# Patient Record
Sex: Male | Born: 1945 | Race: Black or African American | Hispanic: No
Health system: Southern US, Community
[De-identification: ages and names within clinical notes are randomized; demographics above are authoritative.]

---

## 1997-06-14 ENCOUNTER — Emergency Department (HOSPITAL_COMMUNITY): Admission: EM | Admit: 1997-06-14 | Discharge: 1997-06-14 | Payer: Self-pay | Admitting: Internal Medicine

## 1998-03-25 ENCOUNTER — Emergency Department (HOSPITAL_COMMUNITY): Admission: EM | Admit: 1998-03-25 | Discharge: 1998-03-25 | Payer: Self-pay | Admitting: Emergency Medicine

## 1998-03-25 ENCOUNTER — Encounter: Payer: Self-pay | Admitting: Emergency Medicine

## 2000-03-11 ENCOUNTER — Emergency Department (HOSPITAL_COMMUNITY): Admission: EM | Admit: 2000-03-11 | Discharge: 2000-03-11 | Payer: Self-pay | Admitting: Emergency Medicine

## 2000-12-05 ENCOUNTER — Emergency Department (HOSPITAL_COMMUNITY): Admission: EM | Admit: 2000-12-05 | Discharge: 2000-12-05 | Payer: Self-pay

## 2001-03-06 ENCOUNTER — Emergency Department (HOSPITAL_COMMUNITY): Admission: EM | Admit: 2001-03-06 | Discharge: 2001-03-06 | Payer: Self-pay | Admitting: Emergency Medicine

## 2003-01-07 ENCOUNTER — Inpatient Hospital Stay (HOSPITAL_COMMUNITY): Admission: EM | Admit: 2003-01-07 | Discharge: 2003-01-11 | Payer: Self-pay | Admitting: Emergency Medicine

## 2003-01-22 ENCOUNTER — Encounter: Admission: RE | Admit: 2003-01-22 | Discharge: 2003-01-22 | Payer: Self-pay | Admitting: Family Medicine

## 2003-01-28 ENCOUNTER — Encounter: Admission: RE | Admit: 2003-01-28 | Discharge: 2003-01-28 | Payer: Self-pay | Admitting: Sports Medicine

## 2003-02-05 ENCOUNTER — Encounter: Admission: RE | Admit: 2003-02-05 | Discharge: 2003-02-05 | Payer: Self-pay | Admitting: Family Medicine

## 2003-03-15 ENCOUNTER — Encounter: Admission: RE | Admit: 2003-03-15 | Discharge: 2003-03-15 | Payer: Self-pay | Admitting: Family Medicine

## 2003-05-03 ENCOUNTER — Encounter: Admission: RE | Admit: 2003-05-03 | Discharge: 2003-05-03 | Payer: Self-pay | Admitting: Sports Medicine

## 2003-06-21 ENCOUNTER — Ambulatory Visit (HOSPITAL_COMMUNITY): Admission: RE | Admit: 2003-06-21 | Discharge: 2003-06-21 | Payer: Self-pay | Admitting: Family Medicine

## 2003-06-21 ENCOUNTER — Encounter: Admission: RE | Admit: 2003-06-21 | Discharge: 2003-06-21 | Payer: Self-pay | Admitting: Family Medicine

## 2003-07-15 ENCOUNTER — Inpatient Hospital Stay (HOSPITAL_COMMUNITY): Admission: EM | Admit: 2003-07-15 | Discharge: 2003-07-24 | Payer: Self-pay | Admitting: Emergency Medicine

## 2003-08-09 ENCOUNTER — Encounter: Admission: RE | Admit: 2003-08-09 | Discharge: 2003-08-09 | Payer: Self-pay | Admitting: Family Medicine

## 2003-08-23 ENCOUNTER — Ambulatory Visit: Payer: Self-pay | Admitting: Sports Medicine

## 2003-08-23 ENCOUNTER — Inpatient Hospital Stay (HOSPITAL_COMMUNITY): Admission: EM | Admit: 2003-08-23 | Discharge: 2003-09-03 | Payer: Self-pay | Admitting: Emergency Medicine

## 2003-09-16 ENCOUNTER — Ambulatory Visit: Payer: Self-pay | Admitting: Family Medicine

## 2003-10-18 ENCOUNTER — Ambulatory Visit: Payer: Self-pay | Admitting: Family Medicine

## 2003-11-11 ENCOUNTER — Observation Stay (HOSPITAL_COMMUNITY): Admission: EM | Admit: 2003-11-11 | Discharge: 2003-11-12 | Payer: Self-pay

## 2003-11-11 ENCOUNTER — Ambulatory Visit (HOSPITAL_COMMUNITY): Admission: RE | Admit: 2003-11-11 | Discharge: 2003-11-11 | Payer: Self-pay | Admitting: Family Medicine

## 2003-11-11 ENCOUNTER — Ambulatory Visit: Payer: Self-pay | Admitting: Family Medicine

## 2003-12-20 ENCOUNTER — Ambulatory Visit (HOSPITAL_COMMUNITY): Admission: RE | Admit: 2003-12-20 | Discharge: 2003-12-20 | Payer: Self-pay | Admitting: Family Medicine

## 2003-12-20 ENCOUNTER — Ambulatory Visit: Payer: Self-pay | Admitting: Family Medicine

## 2003-12-20 ENCOUNTER — Inpatient Hospital Stay (HOSPITAL_COMMUNITY): Admission: AD | Admit: 2003-12-20 | Discharge: 2003-12-27 | Payer: Self-pay | Admitting: Family Medicine

## 2003-12-20 ENCOUNTER — Ambulatory Visit: Payer: Self-pay | Admitting: Sports Medicine

## 2004-01-17 ENCOUNTER — Inpatient Hospital Stay (HOSPITAL_COMMUNITY): Admission: AD | Admit: 2004-01-17 | Discharge: 2004-02-21 | Payer: Self-pay | Admitting: Family Medicine

## 2004-01-17 ENCOUNTER — Ambulatory Visit: Payer: Self-pay | Admitting: Family Medicine

## 2004-01-17 ENCOUNTER — Ambulatory Visit: Payer: Self-pay | Admitting: Internal Medicine

## 2004-01-17 ENCOUNTER — Ambulatory Visit: Payer: Self-pay | Admitting: Sports Medicine

## 2004-03-05 ENCOUNTER — Ambulatory Visit: Payer: Self-pay | Admitting: Family Medicine

## 2004-03-06 ENCOUNTER — Encounter: Admission: RE | Admit: 2004-03-06 | Discharge: 2004-03-06 | Payer: Self-pay | Admitting: Family Medicine

## 2004-03-09 ENCOUNTER — Ambulatory Visit: Payer: Self-pay | Admitting: Family Medicine

## 2004-03-09 ENCOUNTER — Inpatient Hospital Stay (HOSPITAL_COMMUNITY): Admission: EM | Admit: 2004-03-09 | Discharge: 2004-03-17 | Payer: Self-pay | Admitting: Emergency Medicine

## 2004-03-23 ENCOUNTER — Ambulatory Visit: Payer: Self-pay | Admitting: Internal Medicine

## 2004-04-10 ENCOUNTER — Inpatient Hospital Stay (HOSPITAL_COMMUNITY): Admission: EM | Admit: 2004-04-10 | Discharge: 2004-04-20 | Payer: Self-pay | Admitting: Emergency Medicine

## 2004-04-10 ENCOUNTER — Ambulatory Visit: Payer: Self-pay | Admitting: Infectious Diseases

## 2004-04-13 ENCOUNTER — Ambulatory Visit: Payer: Self-pay | Admitting: Internal Medicine

## 2004-04-16 ENCOUNTER — Ambulatory Visit: Payer: Self-pay | Admitting: Internal Medicine

## 2004-05-04 DEATH — deceased

## 2005-06-18 IMAGING — CR DG CHEST 2V
2 series · 2 of 2 positions shown · non-contrast
Comparison: 12/13/03 and 08/29/03.

CLINICAL DATA: Shortness of breath. 
 PA AND LATERAL CHEST 01/17/04:

[view not recorded (1 of 2)]
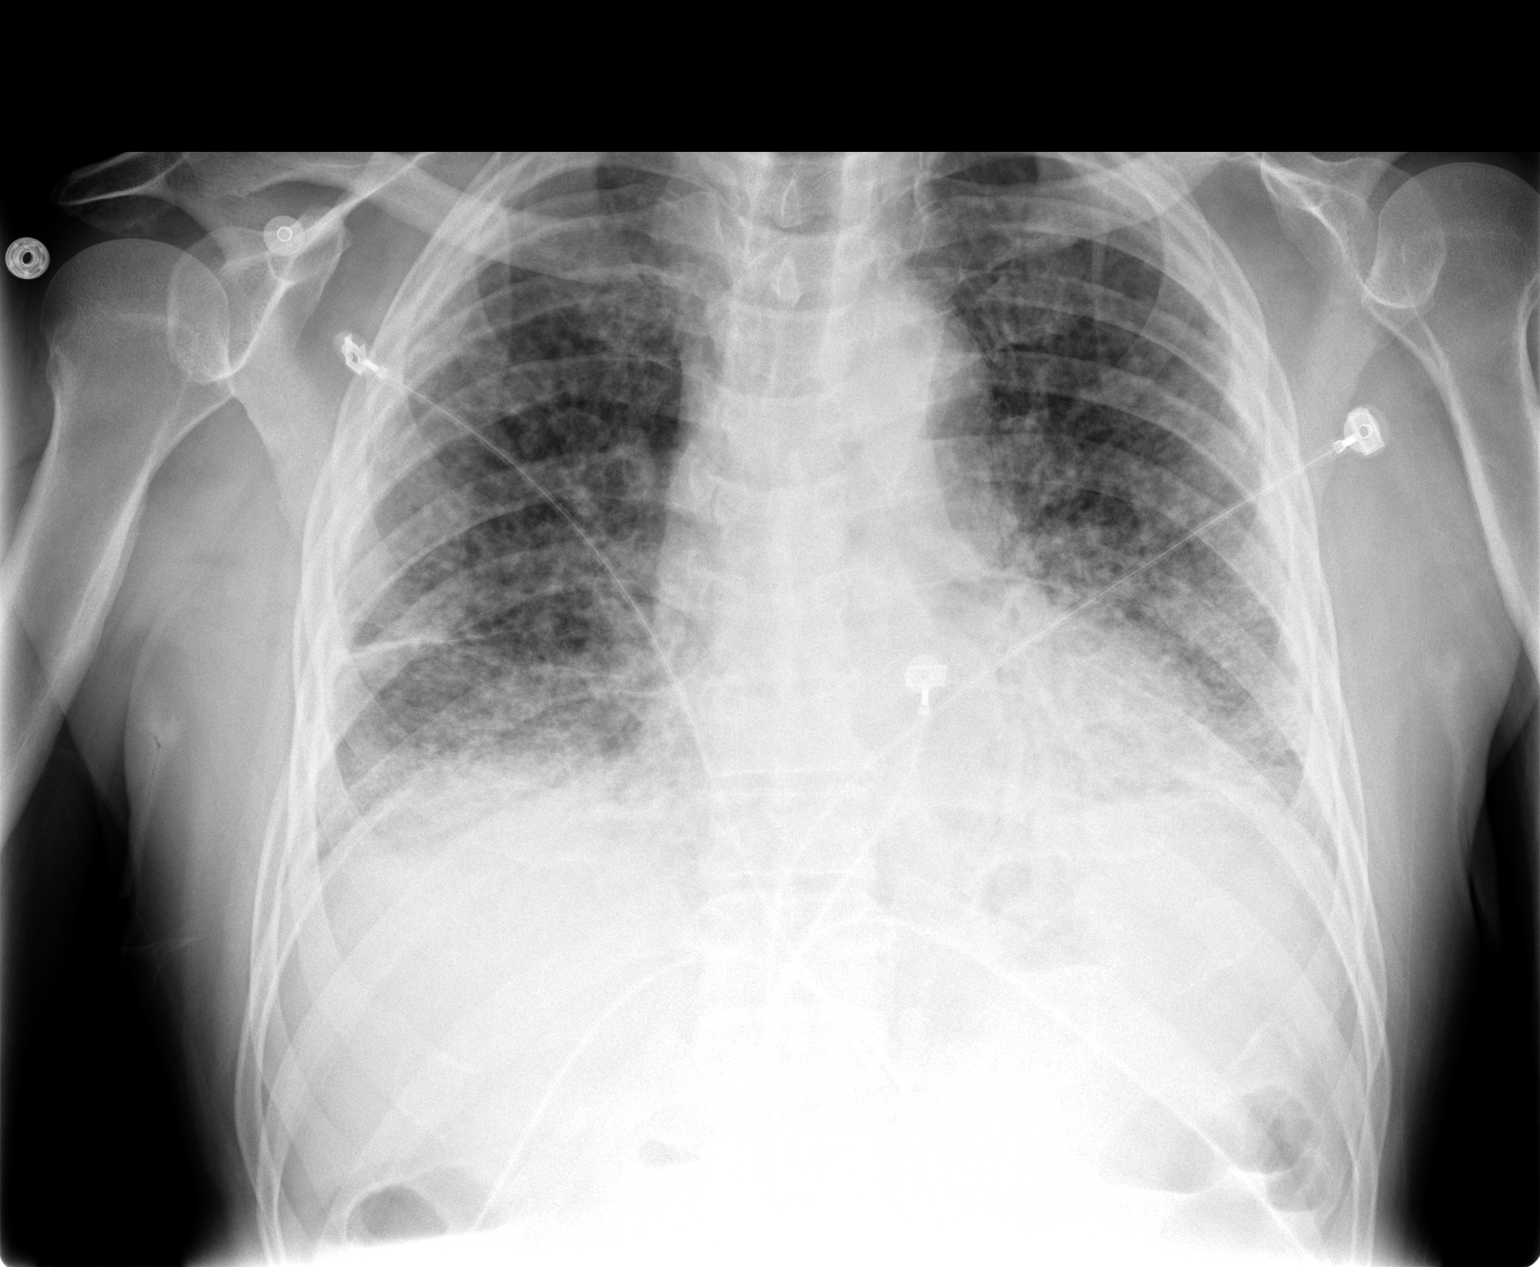

[view not recorded (2 of 2)]
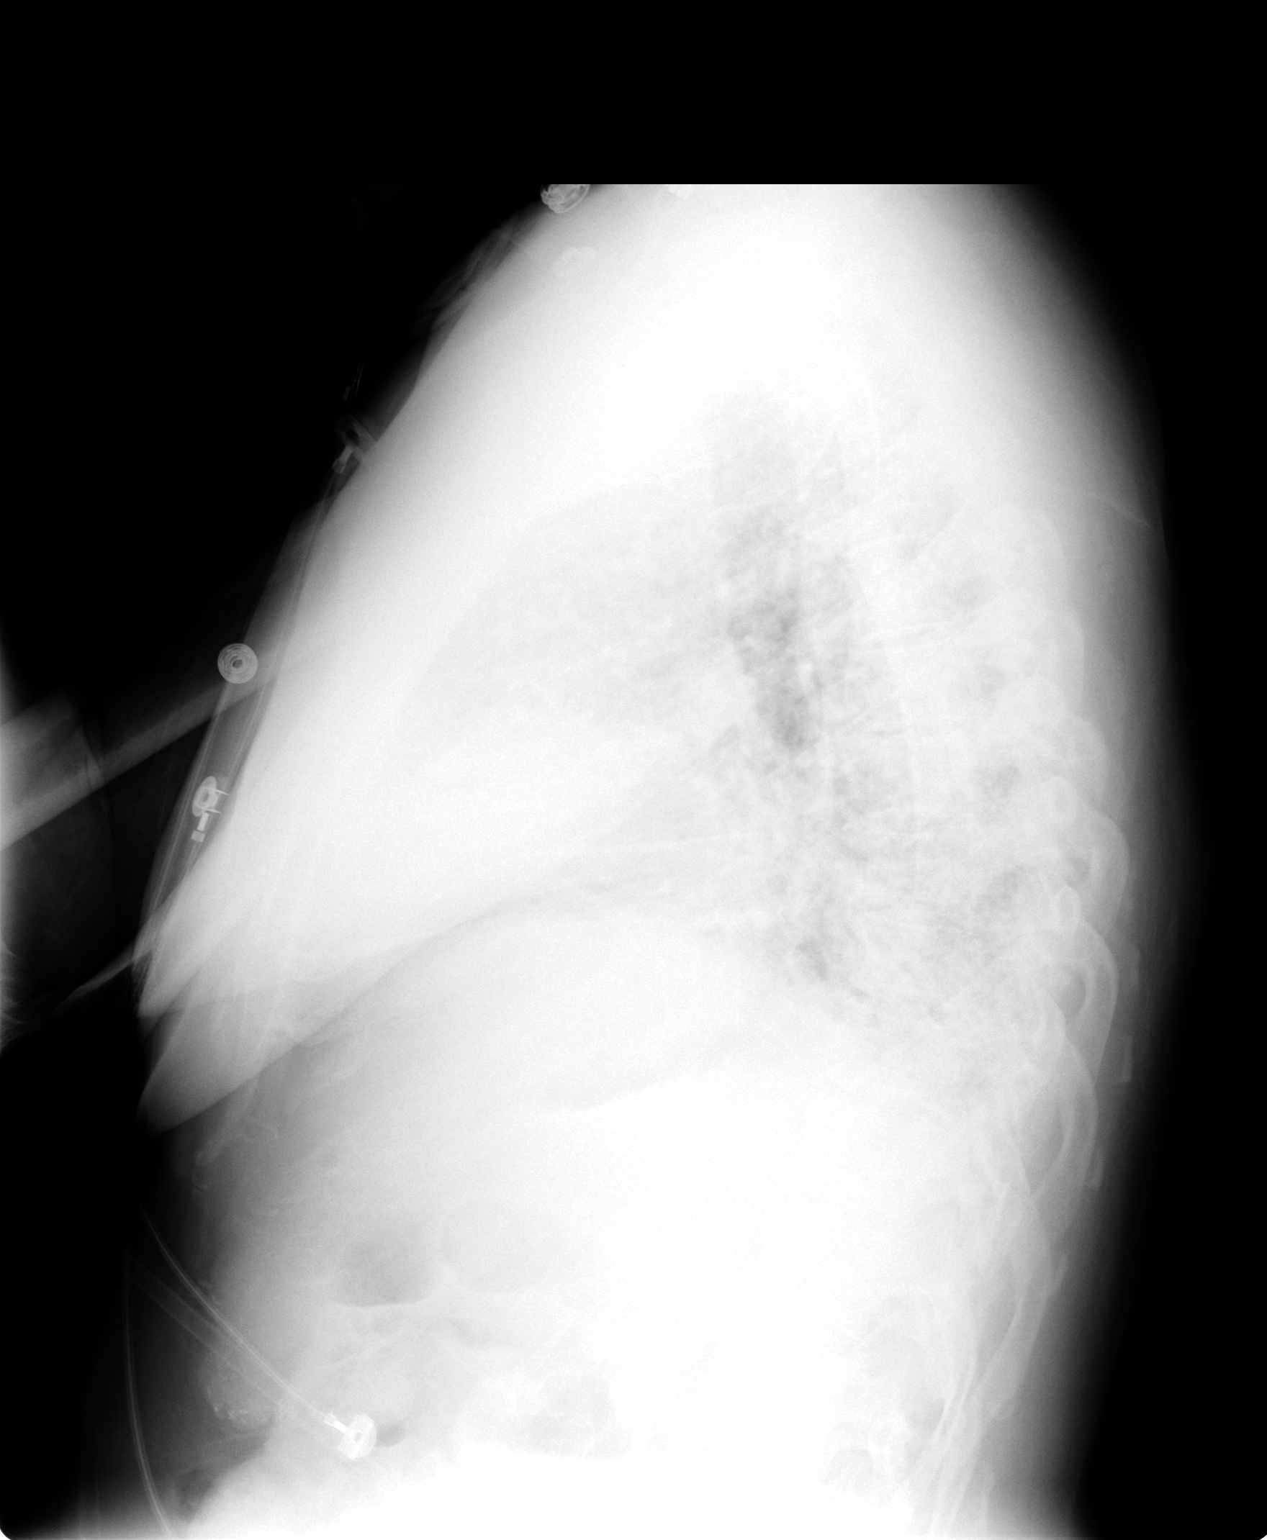

[2 of 2 positions shown; findings below may reference images not displayed]

FINDINGS: The appearance of the patient?s chest is not markedly changed.  Again seen are bilateral diffuse reticular nodular opacities most compatible with chronic interstitial lung disease.  No definite new abnormality is identified.
IMPRESSION: Findings in keeping with chronic interstitial lung disease.  Superimposed infection cannot be excluded given the degree of airspace disease.

## 2005-08-06 IMAGING — CR DG CHEST 2V
2 series · 2 of 2 positions shown · non-contrast
Comparison: none

CLINICAL DATA: Cough and shortness of breath. 
 TWO VIEW CHEST: 
 Two views of the chest are compared to prior films from 02/14/04.  There is a dense reticulonodular intersitial pattern in the lungs which is a stable finding when looking back to prior films from 7882.  Low lung volumes with vascular crowding and basilar atelectasis.  No definite pleural effusions are seen.  High resolution chest CT may be helpful for further evaluation of this process.

[view not recorded (1 of 2)]
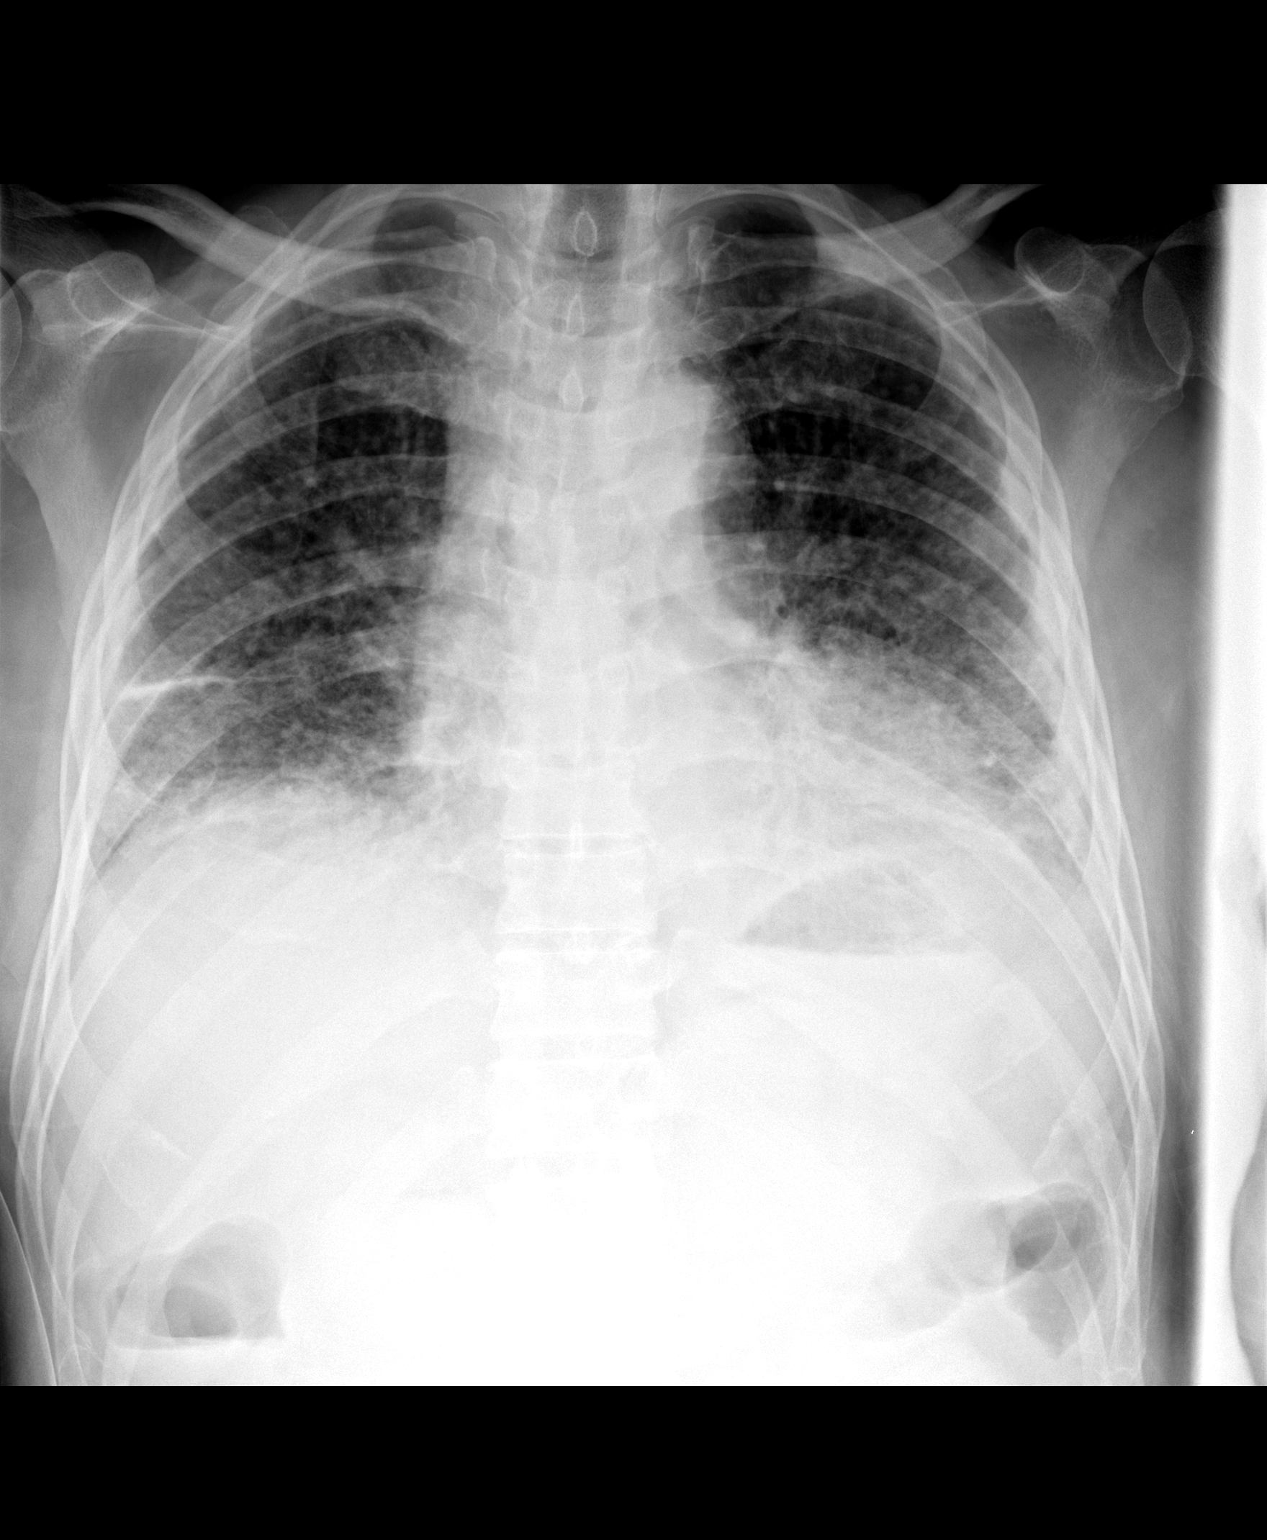

[view not recorded (2 of 2)]
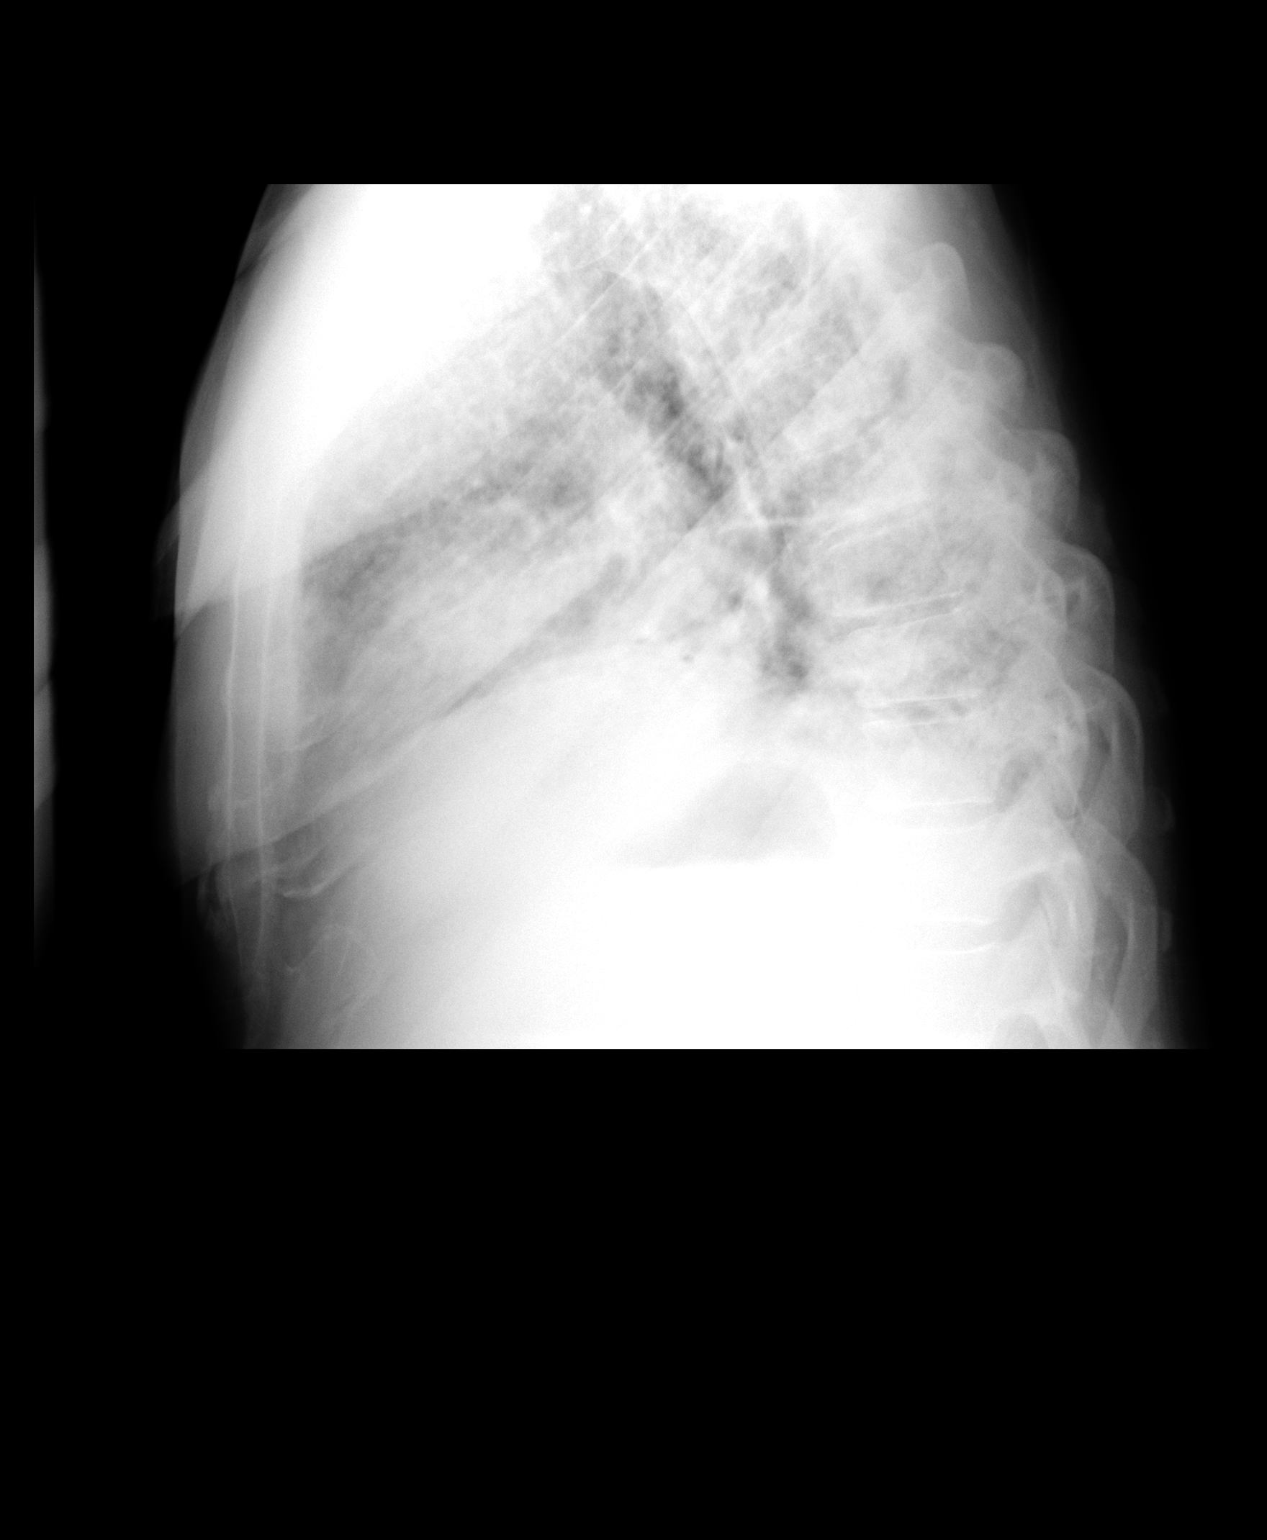

[2 of 2 positions shown; findings below may reference images not displayed]

IMPRESSION: 1.  Dense reticulonodular interstitial pattern appears relatively stable compared with prior study from 7882.  There is bibasilar atelectasis and vascular crowding due to low volume inspiration.  
 2.  High resolution chest CT may be helpful for further evaluation of this process.

## 2005-09-09 IMAGING — CR DG ABDOMEN ACUTE W/ 1V CHEST
3 series · 3 of 3 positions shown · non-contrast
Comparison: Prior studies from [REDACTED] and February 2003.

CLINICAL DATA: 58-year-old with abdominal pain.
 ACUTE ABDOMINAL SERIES:

[view not recorded (1 of 3)]
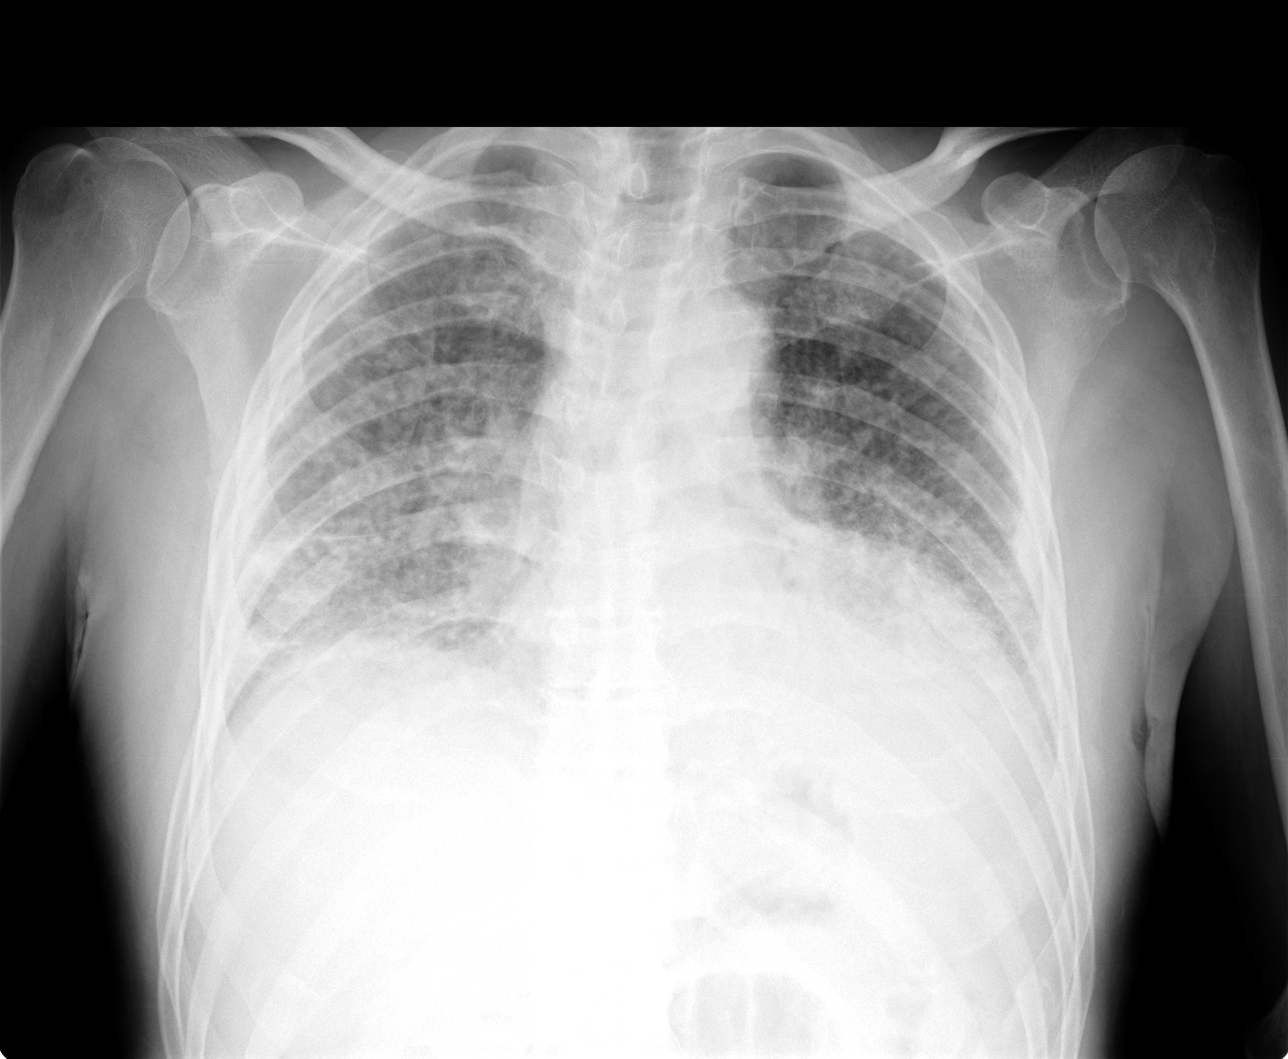

[view not recorded (2 of 3)]
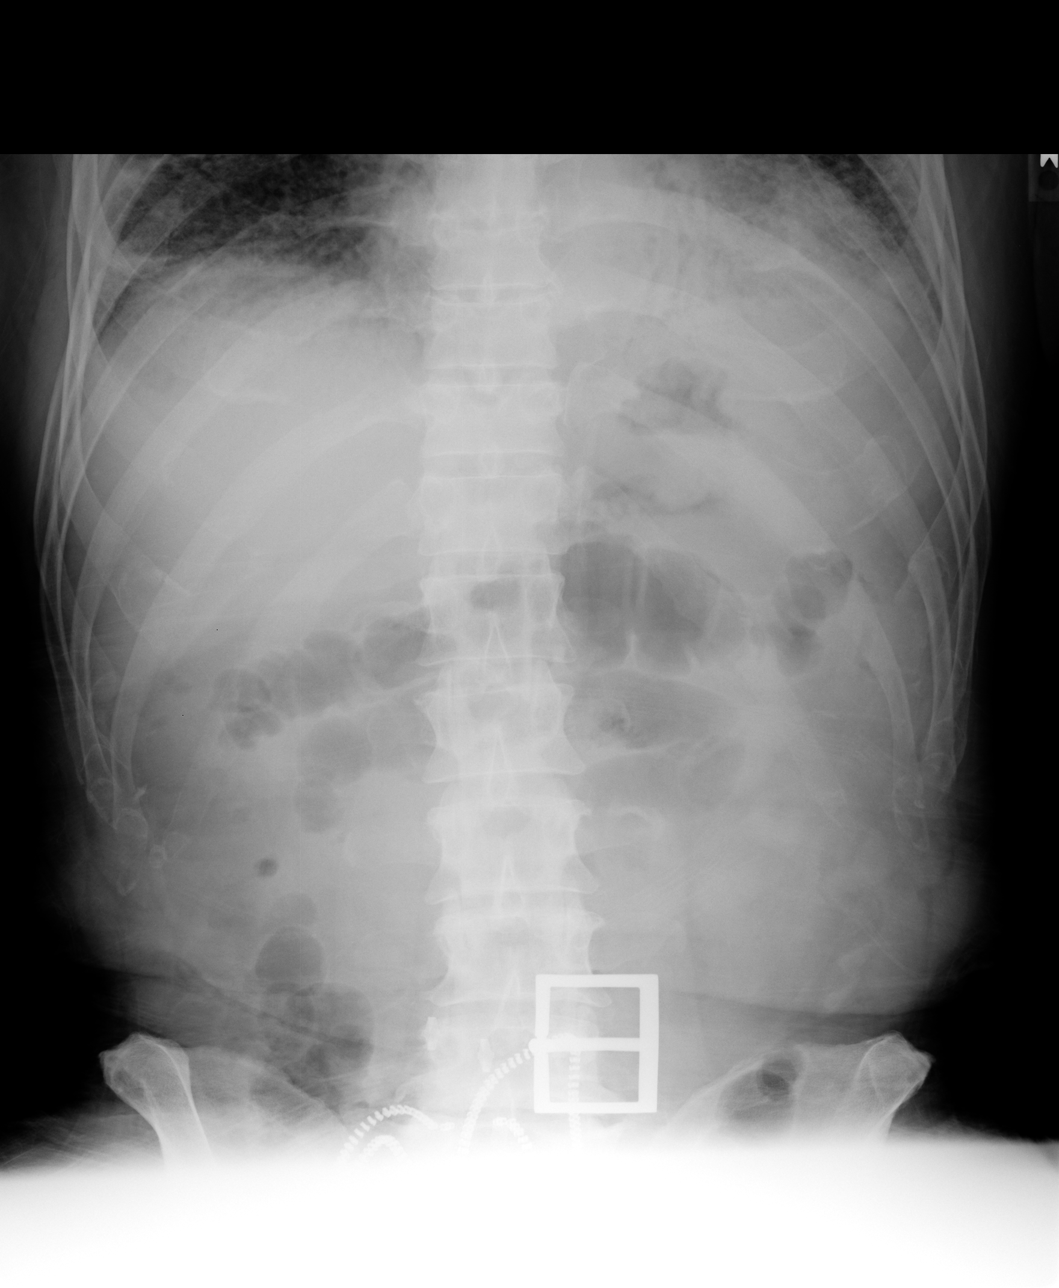

[view not recorded (3 of 3)]
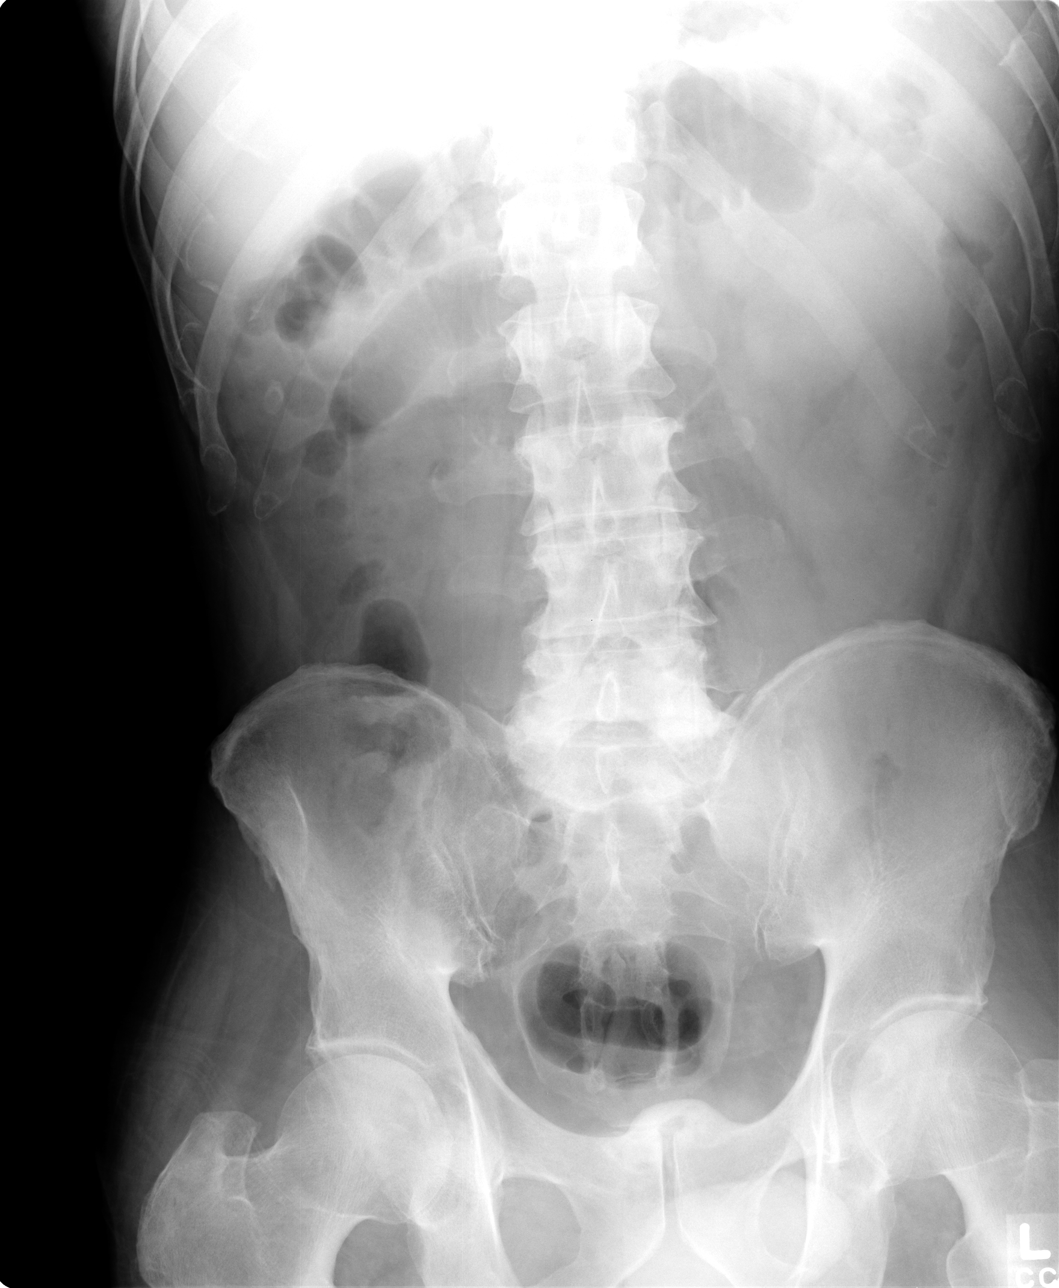

[3 of 3 positions shown; findings below may reference images not displayed]

FINDINGS: Persistent reticular nodular interstitial pattern of the lungs.  There are streaky right basilar atelectasis and airspace consolidation of the left lung base, which are stable findings.  
 Two views of the abdomen demonstrate a nonspecific bowel gas pattern.  There is scattered air in the colon.  There is one loop of small bowel, which is slightly dilated.  It may represent a sentinel loop associated with local inflammatory process.  No evidence for small bowel obstruction.  The soft tissue shadows of the abdomen are maintained.  No free air is seen.
IMPRESSION: 1.  Diffuse reticular nodular interstitial pattern, stable.  There is also right basilar atelectasis and/or scarring and left lower lobe consolidation, which is stable. 
 2.  Nonspecific bowel gas pattern.  There may be a sentinel small bowel loop, which could reflect a local inflammatory process.  No small bowel obstruction.

## 2006-03-03 DIAGNOSIS — R809 Proteinuria, unspecified: Secondary | ICD-10-CM | POA: Insufficient documentation

## 2006-03-03 DIAGNOSIS — D649 Anemia, unspecified: Secondary | ICD-10-CM

## 2006-03-03 DIAGNOSIS — N19 Unspecified kidney failure: Secondary | ICD-10-CM | POA: Insufficient documentation

## 2006-03-03 DIAGNOSIS — I5022 Chronic systolic (congestive) heart failure: Secondary | ICD-10-CM

## 2006-03-03 DIAGNOSIS — I10 Essential (primary) hypertension: Secondary | ICD-10-CM

## 2006-03-03 DIAGNOSIS — H9190 Unspecified hearing loss, unspecified ear: Secondary | ICD-10-CM | POA: Insufficient documentation

## 2006-03-03 DIAGNOSIS — F17200 Nicotine dependence, unspecified, uncomplicated: Secondary | ICD-10-CM

## 2006-03-03 DIAGNOSIS — I517 Cardiomegaly: Secondary | ICD-10-CM

## 2006-03-03 DIAGNOSIS — M109 Gout, unspecified: Secondary | ICD-10-CM | POA: Insufficient documentation

## 2013-09-12 ENCOUNTER — Telehealth: Payer: Self-pay

## 2013-09-13 NOTE — Telephone Encounter (Signed)
Wrong patient
